# Patient Record
Sex: Female | Born: 1965 | Race: Black or African American | Hispanic: No | Marital: Single | Smoking: Never smoker
Health system: Southern US, Community
[De-identification: ages and names within clinical notes are randomized; demographics above are authoritative.]

## PROBLEM LIST (undated history)

## (undated) DIAGNOSIS — M797 Fibromyalgia: Secondary | ICD-10-CM

## (undated) DIAGNOSIS — F329 Major depressive disorder, single episode, unspecified: Secondary | ICD-10-CM

## (undated) DIAGNOSIS — F32A Depression, unspecified: Secondary | ICD-10-CM

## (undated) HISTORY — DX: Fibromyalgia: M79.7

## (undated) HISTORY — DX: Major depressive disorder, single episode, unspecified: F32.9

## (undated) HISTORY — DX: Depression, unspecified: F32.A

## (undated) HISTORY — PX: OTHER SURGICAL HISTORY: SHX169

---

## 2018-04-27 ENCOUNTER — Encounter: Payer: Self-pay | Admitting: Neurology

## 2018-07-27 ENCOUNTER — Encounter: Payer: Self-pay | Admitting: Neurology

## 2018-07-27 ENCOUNTER — Ambulatory Visit: Payer: Medicaid Other | Admitting: Neurology

## 2018-07-27 VITALS — BP 110/70 | HR 112 | Ht 63.0 in | Wt 151.0 lb

## 2018-07-27 DIAGNOSIS — R200 Anesthesia of skin: Secondary | ICD-10-CM

## 2018-07-27 DIAGNOSIS — R202 Paresthesia of skin: Secondary | ICD-10-CM

## 2018-07-27 DIAGNOSIS — Z9889 Other specified postprocedural states: Secondary | ICD-10-CM | POA: Diagnosis not present

## 2018-07-27 DIAGNOSIS — M542 Cervicalgia: Secondary | ICD-10-CM | POA: Diagnosis not present

## 2018-07-27 NOTE — Patient Instructions (Addendum)
MRI cervical spine without contrast  We will call you with the results 
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
## 2018-08-09 ENCOUNTER — Ambulatory Visit
Admission: RE | Admit: 2018-08-09 | Discharge: 2018-08-09 | Disposition: A | Discharge status: Home / Self Care | Payer: Medicaid Other | Source: Ambulatory Visit | Attending: Neurology | Admitting: Neurology

## 2018-08-09 DIAGNOSIS — M542 Cervicalgia: Secondary | ICD-10-CM

## 2018-08-09 DIAGNOSIS — R200 Anesthesia of skin: Secondary | ICD-10-CM

## 2018-08-09 DIAGNOSIS — Z9889 Other specified postprocedural states: Secondary | ICD-10-CM

## 2018-08-09 DIAGNOSIS — R202 Paresthesia of skin: Secondary | ICD-10-CM

## 2018-08-11 ENCOUNTER — Telehealth: Payer: Self-pay | Admitting: *Deleted

## 2018-08-11 NOTE — Telephone Encounter (Signed)
Called patient and gave her the results. She would like to follow up with her surgeon but can't get in touch with him.  Informed her that I will try to locate the surgeon through [facility].

## 2018-08-11 NOTE — Telephone Encounter (Signed)
-----   Message from Glendale Chard, DO sent at 08/11/2018  3:14 PM EST ----- Please inform patient that her MRI cervical spine shows age-related changes and arthritis of the spine and there is narrowing where the nerves exit the spinal cord on both the right and left side, which could cause her arm pain.Marland Kitchen  Hardware from prior surgery looks stable.  Please find out who did her prior neck surgery and we can refer back to them for their opinion, unless she would like to try neck physiotherapy.  Thanks.
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
## 2022-09-02 ENCOUNTER — Ambulatory Visit: Payer: Medicaid Other | Attending: Physician Assistant | Admitting: Physical Therapy

## 2022-09-02 ENCOUNTER — Encounter: Payer: Self-pay | Admitting: Physical Therapy

## 2022-09-02 ENCOUNTER — Other Ambulatory Visit: Payer: Self-pay

## 2022-09-02 DIAGNOSIS — M542 Cervicalgia: Secondary | ICD-10-CM | POA: Insufficient documentation

## 2022-09-02 DIAGNOSIS — M6281 Muscle weakness (generalized): Secondary | ICD-10-CM | POA: Insufficient documentation

## 2022-09-02 DIAGNOSIS — R293 Abnormal posture: Secondary | ICD-10-CM | POA: Insufficient documentation

## 2022-09-02 DIAGNOSIS — M62838 Other muscle spasm: Secondary | ICD-10-CM | POA: Insufficient documentation

## 2022-09-04 ENCOUNTER — Ambulatory Visit: Payer: Medicaid Other

## 2022-09-04 DIAGNOSIS — M62838 Other muscle spasm: Secondary | ICD-10-CM

## 2022-09-04 DIAGNOSIS — M542 Cervicalgia: Secondary | ICD-10-CM | POA: Diagnosis not present

## 2022-09-04 DIAGNOSIS — M6281 Muscle weakness (generalized): Secondary | ICD-10-CM

## 2022-09-04 DIAGNOSIS — R293 Abnormal posture: Secondary | ICD-10-CM

## 2022-09-04 NOTE — Therapy (Signed)
OUTPATIENT PHYSICAL THERAPY TREATMENT   Patient Name: Sylvia Martin MRN: [id number] DOB:05/11/1966, 57 y.o., female Today's Date: 09/04/2022  END OF SESSION:  PT End of Session - 09/04/22 1608     Visit Number 2    Date for PT Re-Evaluation 10/14/22    Authorization Type [employer]    PT Start Time 1525    PT Stop Time 1610   15 min moist heat   PT Time Calculation (min) 45 min    Activity Tolerance Patient limited by pain;Patient tolerated treatment well    Behavior During Therapy [facility] for tasks assessed/performed              Past Medical History:  Diagnosis Date   Depression    Fibromyalgia    Past Surgical History:  Procedure Laterality Date   ACDF C3-4     There are no problems to display for this patient.   PCP: Jackie Plum, MD  REFERRING PROVIDER: Norm Salt, [place]  REFERRING DIAG: [phone] (ICD-10-CM) - Cervicalgia   THERAPY DIAG:  Cervicalgia  Other muscle spasm  Muscle weakness (generalized)  Abnormal posture  RATIONALE FOR EVALUATION AND TREATMENT: Rehabilitation  ONSET DATE: July 2023   NEXT MD VISIT: pt unsure    SUBJECTIVE:                                                                                                                                                                                                         SUBJECTIVE STATEMENT: Pt reports improved pain today. Definitely thinks the exercises are helping.   PAIN:  Are you having pain? Yes: NPRS scale: 3/10 Pain location: bilat shoulder and neck  Pain description: achy throbbing, sharp   Aggravating factors: housework, yard work goes to 10/10 Relieving factors: rest & medication   PERTINENT HISTORY:  ACDF C4-5 with revision of C3-4 on 09/15/19; cervical fusion 2006;  Depression, Fibromyalgia, Neuropathy, DDD, HTN, Prediabetes   PRECAUTIONS: None  WEIGHT BEARING RESTRICTIONS: No  FALLS:  Has patient fallen in last 6 months? No  LIVING ENVIRONMENT: Lives  with: lives alone Lives in: House/apartment Stairs: No Has following equipment at home: None  OCCUPATION: On disability  PLOF: Independent and Leisure: house/yard work   PATIENT GOALS: be able to do house and yard work, muscle spasms and figure out why they are still there despite medication.    OBJECTIVE:   DIAGNOSTIC FINDINGS:  04/06/22 MRI Cervical Spine WO Contrast:  1. Although the study is mildly motion degraded, no acute findings or clear explanation for the patient's symptoms is  identified.  2. A right paracentral disc protrusion at C7-T1 and associated endplate degenerative changes have progressed from MRI of 2020. No cord deformity. Moderate right and mild left foraminal narrowing.  3. Solid interbody fusion post C3-5 fusion with mild residual chronic foraminal narrowing as detailed above.  4. Chronic foraminal narrowing at C5-6 and C6-7 due to spondylosis.   03/25/22 XR Spine Cervical:  1.  Postsurgical changes of C3-C5 cervical fusion with anterior plate and screws at M8-U1 and interbody spacers at C3-C4 and C4-C5. Hardware appears intact and similarly aligned. There is at least partial osseous fusion across the C3-C4 disc space. Osseous fusion across the C4-C5 disc space is less definitive. Recommend correlation with recent CT cervical spine.  2.  Evaluation for abnormal dynamic motion is limited due to poor flexion and extension views. If there is concern for pseudoarthrosis, repeat flexion and extension views should be considered.  3.  Multilevel degenerative disc and facet disease similar to prior, more completely demonstrated on recent CT. Degenerative disc disease appears most pronounced at C5-C6.   PATIENT SURVEYS:  NDI 30/50  COGNITION: Overall cognitive status: Within functional limits for tasks assessed  SENSATION: Tingling down both arms   POSTURE: rounded shoulders, forward head, and weight shift right   PALPATION: Tenderness along bilateral upper traps,  infra & supraspinatus, rhomboids, levator scapulae, biceps, triceps, forearms - very limited tolerance to palpation   CERVICAL ROM:   Active ROM AROM (deg) eval  Flexion 16 *  Extension 19*  Right lateral flexion 25 *  Left lateral flexion 27*  Right rotation Restricted and painful  Left rotation Restricted and painful    (Blank rows = not tested, *=pain )  UPPER EXTREMITY ROM:  Active ROM Right eval Left eval  Shoulder flexion Supine 160* Supine 160*  Shoulder extension WNL * WNL *  Shoulder abduction Supine 128* Supine 130*   Shoulder adduction    Shoulder extension    Shoulder internal rotation WNL* WNL*  Shoulder external rotation WNL* WNL*  Elbow flexion 55 40  Elbow extension -10 -10  Wrist flexion    Wrist extension    Wrist ulnar deviation    Wrist radial deviation    Wrist pronation    Wrist supination     (Blank rows = not tested, * = pain)  UPPER EXTREMITY MMT: defer due to pain intolerance   MMT Right eval Left eval  Shoulder flexion    Shoulder extension    Shoulder abduction    Shoulder adduction    Shoulder extension    Shoulder internal rotation    Shoulder external rotation    Middle trapezius    Lower trapezius    Elbow flexion    Elbow extension    Wrist flexion    Wrist extension    Wrist ulnar deviation    Wrist radial deviation    Wrist pronation    Wrist supination    Grip strength     (Blank rows = not tested)  JOINT MOBILITY:  Unable to test due to pain  CERVICAL SPECIAL TESTS:  Unable to test due to pain  FUNCTIONAL TESTS:  Unable to test due to pain   TODAY'S TREATMENT:  DATE:  09/04/22 Therapeutic Exercise: to improve strength and mobility.  Demo, verbal and tactile cues throughout for technique.  UBE L1.0 x 76min UT stretch 3x10" each side; 15 sec with moist heat  Levator stretch 3x10"  each side; 15 sec with moist heat Supine chin tuck 10x3" Seated shoulder rolls 10x Seated SCM stretch 3x10" Seated shoulder squeeze 10x3"  MHP for 15 min to cervical spine  09/02/22 Initial Eval :  UT Stx 2x10"  LS Stx 2x10"   PATIENT EDUCATION:  Education details: Initial HEP, Initial Eval findings  Person educated: Patient Education method: Explanation, Demonstration, Tactile cues, and Handouts Education comprehension: verbalized understanding, returned demonstration, and needs further education  HOME EXERCISE PROGRAM: Access Code: [email or web address] URL: [email or web address] Date: 09/04/2022 Prepared by: Clarene Essex  Exercises - Seated Upper Trapezius Stretch  - 1 x daily - 7 x weekly - 2 sets - 10 reps - 10 sec   hold - Seated Levator Scapulae Stretch  - 1 x daily - 7 x weekly - 2 sets - 10 reps - 10 second hold - Sternocleidomastoid Stretch  - 1 x daily - 7 x weekly - 3-5 sets - 10 sec hold - Seated Scapular Retraction  - 1 x daily - 7 x weekly - 2 sets - 10 reps   ASSESSMENT:  CLINICAL IMPRESSION: Pt mostly able to complete exercises today w/o increased pain but was limited with certain movements. She had ok tolerance for neck stretches and AROM, although noting pain with chin tucks. Added SCM to session d/t reports of pain along the muscle belly. She did better with the postural exercises and pec stretching but overall very guarded, slow, cautious movements. Concluded session with MHP to reduce pain and stiffness in neck/shoulders.  OBJECTIVE IMPAIRMENTS: decreased activity tolerance, decreased knowledge of condition, decreased knowledge of use of DME, decreased mobility, decreased ROM, decreased strength, increased fascial restrictions, impaired perceived functional ability, increased muscle spasms, impaired flexibility, impaired sensation, impaired tone, impaired UE functional use, improper body mechanics, postural dysfunction, and pain.   ACTIVITY LIMITATIONS:  carrying, lifting, bending, sitting, sleeping, bathing, reach over head, and hygiene/grooming  PARTICIPATION LIMITATIONS: meal prep, cleaning, laundry, driving, community activity, and yard work  PERSONAL FACTORS: Past/current experiences, Time since onset of injury/illness/exacerbation, and 3+ comorbidities: ACDF C4-5 with revision of C3-4 on 09/15/19; cervical fusion 2006;  Depression, Fibromyalgia, Neuropathy, DDD, HTN, Prediabetes  are also affecting patient's functional outcome.   REHAB POTENTIAL: Good  CLINICAL DECISION MAKING: Evolving/moderate complexity  EVALUATION COMPLEXITY: Moderate   GOALS: Goals reviewed with patient? Yes  SHORT TERM GOALS: Target date: 09/23/2022  Pt will be independent with original HEP.  Baseline:  Goal status: IN PROGRESS  2.  Pt will verbalize a decrease in pain by 25% to increase her QOL and increase her involvement in her daily activities.  Baseline: NPS 7/10  Goal status: IN PROGRESS  3.  Pt will demonstrate a decrease in pain while performing UE MMT to improve her tolerance to strength training and increase her daily function Baseline: unable to test due to pain  Goal status: IN PROGRESS  4.  Pt will demonstrate an increase of 10 in bilateral shoulder flexion and abduction to increase her participation in yard and house work.  Baseline: see ROM chart  Goal status: IN PROGRESS  LONG TERM GOALS: Target date: 10/14/2022  Pt will demonstrate independence with ongoing HEP for self-management at home.  Baseline:  Goal status: IN PROGRESS  2.  Pt will report a score  of </= 22.5 on the NDI to demonstrate an increase in function and decrease in pain Baseline: 30/50  Goal status: IN PROGRESS  3.  Pt will verbalize a decrease in pain of 50-75% to increase her QOL and increase her involvement in her community Baseline: NPS 7/10  Goal status: IN PROGRESS  4.  Patient to demonstrate improved upright posture with posterior shoulder girdle engaged  to promote improved glenohumeral joint mobility.  Baseline:  Goal status: IN PROGRESS  5.  Pt will demonstrate a decrease in muscle tension along her bilateral UT and periscapular muscles as evidenced by more relaxed posture in neck and upper shoulders. Baseline:  Goal status: IN PROGRESS  6.  Pt will be able to do her yard and house work with minimal to no pain and discomfort.  Baseline:  Goal status: IN PROGRESS  7.  Pt will increase her grossly cervical ROM in order to be able to look over her shoulder and surrounding areas while driving.  Baseline:  Goal status: IN PROGRESS   PLAN:  PT FREQUENCY: 2x/week  PT DURATION: 6 weeks  PLANNED INTERVENTIONS: Therapeutic exercises, Therapeutic activity, Neuromuscular re-education, Patient/Family education, Self Care, Joint mobilization, DME instructions, Dry Needling, Electrical stimulation, Spinal mobilization, Moist heat, Taping, Ultrasound, Manual therapy, and Re-evaluation  PLAN FOR NEXT SESSION: try starting session with moist heat/e-stim to increase tolerance to movement, on next visit with PT finish objective testing per pt pain tolerance.   Artist Pais, PTA 09/04/2022, 4:22 PM

## 2022-09-09 ENCOUNTER — Ambulatory Visit: Payer: Medicaid Other | Admitting: Physical Therapy

## 2022-09-12 ENCOUNTER — Ambulatory Visit: Payer: Medicaid Other | Admitting: Physical Therapy

## 2022-09-16 ENCOUNTER — Ambulatory Visit: Payer: Medicaid Other

## 2022-09-16 DIAGNOSIS — M542 Cervicalgia: Secondary | ICD-10-CM | POA: Diagnosis not present

## 2022-09-16 DIAGNOSIS — M6281 Muscle weakness (generalized): Secondary | ICD-10-CM

## 2022-09-16 DIAGNOSIS — M62838 Other muscle spasm: Secondary | ICD-10-CM

## 2022-09-16 DIAGNOSIS — R293 Abnormal posture: Secondary | ICD-10-CM

## 2022-09-16 NOTE — Therapy (Signed)
OUTPATIENT PHYSICAL THERAPY TREATMENT   Patient Name: Sylvia Martin MRN: [id number] DOB:1966/03/13, 57 y.o., female Today's Date: 09/16/2022  END OF SESSION:  PT End of Session - 09/16/22 1701     Visit Number 3    Date for PT Re-Evaluation 10/14/22    Authorization Type [employer] Medicaid    PT Start Time 1612    PT Stop Time [id number]    PT Time Calculation (min) 53 min    Activity Tolerance Patient limited by pain;Patient tolerated treatment well    Behavior During Therapy [facility] for tasks assessed/performed               Past Medical History:  Diagnosis Date   Depression    Fibromyalgia    Past Surgical History:  Procedure Laterality Date   ACDF C3-4     There are no problems to display for this patient.   PCP: Benito Mccreedy, MD  REFERRING PROVIDER: Trey Sailors, [place]  REFERRING DIAG: [phone] (ICD-10-CM) - Cervicalgia   THERAPY DIAG:  Cervicalgia  Other muscle spasm  Muscle weakness (generalized)  Abnormal posture  RATIONALE FOR EVALUATION AND TREATMENT: Rehabilitation  ONSET DATE: July 2023   NEXT MD VISIT: pt unsure    SUBJECTIVE:                                                                                                                                                                                                         SUBJECTIVE STATEMENT: Pt reports aching pain today.  PAIN:  Are you having pain? Yes: NPRS scale: 4/10 Pain location: bilat shoulder and neck  Pain description: achy   Aggravating factors: housework, yard work goes to 10/10 Relieving factors: rest & medication   PERTINENT HISTORY:  ACDF C4-5 with revision of C3-4 on 09/15/19; cervical fusion 2006;  Depression, Fibromyalgia, Neuropathy, DDD, HTN, Prediabetes   PRECAUTIONS: None  WEIGHT BEARING RESTRICTIONS: No  FALLS:  Has patient fallen in last 6 months? No  LIVING ENVIRONMENT: Lives with: lives alone Lives in: House/apartment Stairs: No Has following  equipment at home: None  OCCUPATION: On disability  PLOF: Independent and Leisure: house/yard work   PATIENT GOALS: be able to do house and yard work, muscle spasms and figure out why they are still there despite medication.    OBJECTIVE:   DIAGNOSTIC FINDINGS:  04/06/22 MRI Cervical Spine WO Contrast:  1. Although the study is mildly motion degraded, no acute findings or clear explanation for the patient's symptoms is identified.  2. A right paracentral disc protrusion at C7-T1 and associated endplate  degenerative changes have progressed from MRI of 2020. No cord deformity. Moderate right and mild left foraminal narrowing.  3. Solid interbody fusion post C3-5 fusion with mild residual chronic foraminal narrowing as detailed above.  4. Chronic foraminal narrowing at C5-6 and C6-7 due to spondylosis.   03/25/22 XR Spine Cervical:  1.  Postsurgical changes of C3-C5 cervical fusion with anterior plate and screws at [id number] and interbody spacers at C3-C4 and C4-C5. Hardware appears intact and similarly aligned. There is at least partial osseous fusion across the C3-C4 disc space. Osseous fusion across the C4-C5 disc space is less definitive. Recommend correlation with recent CT cervical spine.  2.  Evaluation for abnormal dynamic motion is limited due to poor flexion and extension views. If there is concern for pseudoarthrosis, repeat flexion and extension views should be considered.  3.  Multilevel degenerative disc and facet disease similar to prior, more completely demonstrated on recent CT. Degenerative disc disease appears most pronounced at C5-C6.   PATIENT SURVEYS:  NDI 30/50  COGNITION: Overall cognitive status: Within functional limits for tasks assessed  SENSATION: Tingling down both arms   POSTURE: rounded shoulders, forward head, and weight shift right   PALPATION: Tenderness along bilateral upper traps, infra & supraspinatus, rhomboids, levator scapulae, biceps, triceps,  forearms - very limited tolerance to palpation   CERVICAL ROM:   Active ROM AROM (deg) eval  Flexion 16 *  Extension 19*  Right lateral flexion 25 *  Left lateral flexion 27*  Right rotation Restricted and painful  Left rotation Restricted and painful    (Blank rows = not tested, *=pain )  UPPER EXTREMITY ROM:  Active ROM Right eval Left eval  Shoulder flexion Supine 160* Supine 160*  Shoulder extension WNL * WNL *  Shoulder abduction Supine 128* Supine 130*   Shoulder adduction    Shoulder extension    Shoulder internal rotation WNL* WNL*  Shoulder external rotation WNL* WNL*  Elbow flexion 55 40  Elbow extension -10 -10  Wrist flexion    Wrist extension    Wrist ulnar deviation    Wrist radial deviation    Wrist pronation    Wrist supination     (Blank rows = not tested, * = pain)  UPPER EXTREMITY MMT: defer due to pain intolerance   MMT Right eval Left eval  Shoulder flexion    Shoulder extension    Shoulder abduction    Shoulder adduction    Shoulder extension    Shoulder internal rotation    Shoulder external rotation    Middle trapezius    Lower trapezius    Elbow flexion    Elbow extension    Wrist flexion    Wrist extension    Wrist ulnar deviation    Wrist radial deviation    Wrist pronation    Wrist supination    Grip strength     (Blank rows = not tested)  JOINT MOBILITY:  Unable to test due to pain  CERVICAL SPECIAL TESTS:  Unable to test due to pain  FUNCTIONAL TESTS:  Unable to test due to pain   TODAY'S TREATMENT:  DATE:  09/16/22 Therapeutic Exercise: to improve strength and mobility.  Demo, verbal and tactile cues throughout for technique.  UBE L1.0 x 4 min with moist heat Doorway pec stretch with moist heat 3x15" Standing scap retraction in doorway x 10  Standing chin tuck in doorway x  10 Cervical isometrics:  -flexion x 5 3 sec hold  - sidebend x 5 3 sec hold  - rotation x 5 3 sec hold Seated thoracic ext x 10  Attempted IASTM with foam roll to cervical and thoracic PS muscles  Estim: IFC 80-150 Hz intensity to tolerance x 12 min with moist heat  09/04/22 Therapeutic Exercise: to improve strength and mobility.  Demo, verbal and tactile cues throughout for technique.  UBE L1.0 x 92mn UT stretch 3x10" each side; 15 sec with moist heat  Levator stretch 3x10" each side; 15 sec with moist heat Supine chin tuck 10x3" Seated shoulder rolls 10x Seated SCM stretch 3x10" Seated shoulder squeeze 10x3"  MHP for 15 min to cervical spine  09/02/22 Initial Eval :  UT Stx 2x10"  LS Stx 2x10"   PATIENT EDUCATION:  Education details: Initial HEP, Initial Eval findings  Person educated: Patient Education method: Explanation, Demonstration, Tactile cues, and Handouts Education comprehension: verbalized understanding, returned demonstration, and needs further education  HOME EXERCISE PROGRAM: Access Code: [email or web address] URL: [email or web address] Date: 09/16/2022 Prepared by: BClarene Essex Exercises - Seated Upper Trapezius Stretch  - 1 x daily - 7 x weekly - 2 sets - 10 reps - 10 sec   hold - Seated Levator Scapulae Stretch  - 1 x daily - 7 x weekly - 2 sets - 10 reps - 10 second hold - Sternocleidomastoid Stretch  - 1 x daily - 7 x weekly - 3-5 sets - 10 sec hold - Seated Scapular Retraction  - 1 x daily - 7 x weekly - 2 sets - 10 reps - Standing Isometric Cervical Flexion with Manual Resistance  - 1 x daily - 7 x weekly - 2 sets - 10 reps - 5-10 sec hold - Standing Isometric Cervical Sidebending with Manual Resistance  - 1 x daily - 7 x weekly - 2 sets - 10 reps - 5-10 sec hold - Seated Isometric Cervical Rotation  - 1 x daily - 7 x weekly - 2 sets - 5 reps - 5-10 sec hold   ASSESSMENT:  CLINICAL IMPRESSION: Pt continues to be guarded with slow, cautious  movements throughout session. Attempted  starting session with heat, however this did not improve tolerance for UBE warm up. The best exercises that minimized pain today were the cervical isometrics, which were added to HEP. Pt again was very sensitive to touch, we were unable to do any STM but did estim after session which helped reduce some tension and pain.   OBJECTIVE IMPAIRMENTS: decreased activity tolerance, decreased knowledge of condition, decreased knowledge of use of DME, decreased mobility, decreased ROM, decreased strength, increased fascial restrictions, impaired perceived functional ability, increased muscle spasms, impaired flexibility, impaired sensation, impaired tone, impaired UE functional use, improper body mechanics, postural dysfunction, and pain.   ACTIVITY LIMITATIONS: carrying, lifting, bending, sitting, sleeping, bathing, reach over head, and hygiene/grooming  PARTICIPATION LIMITATIONS: meal prep, cleaning, laundry, driving, community activity, and yard work  PERSONAL FACTORS: Past/current experiences, Time since onset of injury/illness/exacerbation, and 3+ comorbidities: ACDF C4-5 with revision of C3-4 on 09/15/19; cervical fusion 2006;  Depression, Fibromyalgia, Neuropathy, DDD, HTN, Prediabetes  are also affecting patient's functional outcome.   REHAB  POTENTIAL: Good  CLINICAL DECISION MAKING: Evolving/moderate complexity  EVALUATION COMPLEXITY: Moderate   GOALS: Goals reviewed with patient? Yes  SHORT TERM GOALS: Target date: 09/23/2022  Pt will be independent with original HEP.  Baseline:  Goal status: IN PROGRESS  2.  Pt will verbalize a decrease in pain by 25% to increase her QOL and increase her involvement in her daily activities.  Baseline: NPS 7/10  Goal status: IN PROGRESS  3.  Pt will demonstrate a decrease in pain while performing UE MMT to improve her tolerance to strength training and increase her daily function Baseline: unable to test due to  pain  Goal status: IN PROGRESS  4.  Pt will demonstrate an increase of 10 in bilateral shoulder flexion and abduction to increase her participation in yard and house work.  Baseline: see ROM chart  Goal status: IN PROGRESS  LONG TERM GOALS: Target date: 10/14/2022  Pt will demonstrate independence with ongoing HEP for self-management at home.  Baseline:  Goal status: IN PROGRESS  2.  Pt will report a score of </= 22.5 on the NDI to demonstrate an increase in function and decrease in pain Baseline: 30/50  Goal status: IN PROGRESS  3.  Pt will verbalize a decrease in pain of 50-75% to increase her QOL and increase her involvement in her community Baseline: NPS 7/10  Goal status: IN PROGRESS  4.  Patient to demonstrate improved upright posture with posterior shoulder girdle engaged to promote improved glenohumeral joint mobility.  Baseline:  Goal status: IN PROGRESS  5.  Pt will demonstrate a decrease in muscle tension along her bilateral UT and periscapular muscles as evidenced by more relaxed posture in neck and upper shoulders. Baseline:  Goal status: IN PROGRESS  6.  Pt will be able to do her yard and house work with minimal to no pain and discomfort.  Baseline:  Goal status: IN PROGRESS  7.  Pt will increase her grossly cervical ROM in order to be able to look over her shoulder and surrounding areas while driving.  Baseline:  Goal status: IN PROGRESS   PLAN:  PT FREQUENCY: 2x/week  PT DURATION: 6 weeks  PLANNED INTERVENTIONS: Therapeutic exercises, Therapeutic activity, Neuromuscular re-education, Patient/Family education, Self Care, Joint mobilization, DME instructions, Dry Needling, Electrical stimulation, Spinal mobilization, Moist heat, Taping, Ultrasound, Manual therapy, and Re-evaluation  PLAN FOR NEXT SESSION: try starting session with moist heat/e-stim to increase tolerance to movement, on next visit with PT finish objective testing per pt pain  tolerance.   Artist Pais, PTA 09/16/2022, 5:53 PM

## 2022-09-19 ENCOUNTER — Encounter: Payer: Self-pay | Admitting: Physical Therapy

## 2022-09-19 ENCOUNTER — Ambulatory Visit: Payer: Medicaid Other | Admitting: Physical Therapy

## 2022-09-19 DIAGNOSIS — M62838 Other muscle spasm: Secondary | ICD-10-CM

## 2022-09-19 DIAGNOSIS — M542 Cervicalgia: Secondary | ICD-10-CM

## 2022-09-19 DIAGNOSIS — M6281 Muscle weakness (generalized): Secondary | ICD-10-CM

## 2022-09-19 DIAGNOSIS — R293 Abnormal posture: Secondary | ICD-10-CM

## 2022-09-19 NOTE — Therapy (Addendum)
OUTPATIENT PHYSICAL THERAPY TREATMENT   Patient Name: Sylvia Martin MRN: [id number] DOB:January 16, 1966, 57 y.o., female Today's Date: 09/19/2022  END OF SESSION:  PT End of Session - 09/19/22 1530     Visit Number 4    Date for PT Re-Evaluation 10/14/22    Authorization Type [employer]    PT Start Time [id number]    PT Stop Time 1625    PT Time Calculation (min) 55 min    Activity Tolerance Patient tolerated treatment well    Behavior During Therapy WFL for tasks assessed/performed                Past Medical History:  Diagnosis Date   Depression    Fibromyalgia    Past Surgical History:  Procedure Laterality Date   ACDF C3-4     There are no problems to display for this patient.   PCP: Benito Mccreedy, MD  REFERRING PROVIDER: Trey Sailors, PA  REFERRING DIAG: [phone] (ICD-10-CM) - Cervicalgia   THERAPY DIAG:  Cervicalgia  Other muscle spasm  Muscle weakness (generalized)  Abnormal posture  RATIONALE FOR EVALUATION AND TREATMENT: Rehabilitation  ONSET DATE: July 2023   NEXT MD VISIT: pt unsure    SUBJECTIVE:                                                                                                                                                                                                         SUBJECTIVE STATEMENT: Pt reports aching pain today in L/R arm and Trap and is sore from last session, HEP has been going fine although she is still in pain when doing them.   PAIN:  Are you having pain? Yes: NPRS scale: 8/10 Pain location: bilat shoulder and neck  Pain description: achy   Aggravating factors: housework, yard work goes to 10/10 Relieving factors: rest & medication   PERTINENT HISTORY:  ACDF C4-5 with revision of C3-4 on 09/15/19; cervical fusion 2006;  Depression, Fibromyalgia, Neuropathy, DDD, HTN, Prediabetes   PRECAUTIONS: None  WEIGHT BEARING RESTRICTIONS: No  FALLS:  Has patient fallen in last 6 months? No  LIVING  ENVIRONMENT: Lives with: lives alone Lives in: House/apartment Stairs: No Has following equipment at home: None  OCCUPATION: On disability  PLOF: Independent and Leisure: house/yard work   PATIENT GOALS: be able to do house and yard work, muscle spasms and figure out why they are still there despite medication.    OBJECTIVE:   DIAGNOSTIC FINDINGS:  04/06/22 MRI Cervical Spine WO Contrast:  1. Although the study is mildly motion degraded,  no acute findings or clear explanation for the patient's symptoms is identified.  2. A right paracentral disc protrusion at C7-T1 and associated endplate degenerative changes have progressed from MRI of 2020. No cord deformity. Moderate right and mild left foraminal narrowing.  3. Solid interbody fusion post C3-5 fusion with mild residual chronic foraminal narrowing as detailed above.  4. Chronic foraminal narrowing at C5-6 and C6-7 due to spondylosis.   03/25/22 XR Spine Cervical:  1.  Postsurgical changes of C3-C5 cervical fusion with anterior plate and screws at [id number] and interbody spacers at C3-C4 and C4-C5. Hardware appears intact and similarly aligned. There is at least partial osseous fusion across the C3-C4 disc space. Osseous fusion across the C4-C5 disc space is less definitive. Recommend correlation with recent CT cervical spine.  2.  Evaluation for abnormal dynamic motion is limited due to poor flexion and extension views. If there is concern for pseudoarthrosis, repeat flexion and extension views should be considered.  3.  Multilevel degenerative disc and facet disease similar to prior, more completely demonstrated on recent CT. Degenerative disc disease appears most pronounced at C5-C6.   PATIENT SURVEYS:  NDI 30/50  COGNITION: Overall cognitive status: Within functional limits for tasks assessed  SENSATION: Tingling down both arms   POSTURE: rounded shoulders, forward head, and weight shift right   PALPATION: Tenderness along  bilateral upper traps, infra & supraspinatus, rhomboids, levator scapulae, biceps, triceps, forearms - very limited tolerance to palpation   CERVICAL ROM:   Active ROM AROM (deg) eval  Flexion 16 *  Extension 19*  Right lateral flexion 25 *  Left lateral flexion 27*  Right rotation Restricted and painful  Left rotation Restricted and painful    (Blank rows = not tested, *=pain )  UPPER EXTREMITY ROM:  Active ROM Right eval Left eval  Shoulder flexion Supine 160* Supine 160*  Shoulder extension WNL * WNL *  Shoulder abduction Supine 128* Supine 130*   Shoulder adduction    Shoulder extension    Shoulder internal rotation WNL* WNL*  Shoulder external rotation WNL* WNL*  Elbow flexion 55 40  Elbow extension -10 -10  Wrist flexion    Wrist extension    Wrist ulnar deviation    Wrist radial deviation    Wrist pronation    Wrist supination     (Blank rows = not tested, * = pain)  UPPER EXTREMITY MMT: defer due to pain intolerance   MMT Right eval Left eval  Shoulder flexion    Shoulder extension    Shoulder abduction    Shoulder adduction    Shoulder extension    Shoulder internal rotation    Shoulder external rotation    Middle trapezius    Lower trapezius    Elbow flexion    Elbow extension    Wrist flexion    Wrist extension    Wrist ulnar deviation    Wrist radial deviation    Wrist pronation    Wrist supination    Grip strength     (Blank rows = not tested)  JOINT MOBILITY:  Unable to test due to pain  CERVICAL SPECIAL TESTS:  Unable to test due to pain  FUNCTIONAL TESTS:  Unable to test due to pain   TODAY'S TREATMENT:  DATE:   09/19/22 THERAPEUTIC EXERCISE: to improve flexibility, strength and mobility.  Verbal and tactile cues throughout for technique. Moist heat + UBE L1 x5 min forward  Cervical isometrics:  -  flexion x 5 3 sec hold  - sidebend x 5 3 sec hold  - rotation x 5 3 sec hold UT stretch 3x10" each side with moist heat  Levator stretch 3x10" each side with moist heat Seated shoulder rolls 2x10 Seated Scapular squeezes 2x10  Seated Chin Tucks x10  Shoulder Horizontal Abduction AROM w/ scapular retraction x10  Doorway pec stretch 3x10"  MODALITIES:  IFC to B neck and upper shoulders, 80-150 Hz, intensity to patient tolerance x 15 min  Moist heat to B neck and upper shoulders with estim   09/16/22 Therapeutic Exercise: to improve strength and mobility.  Demo, verbal and tactile cues throughout for technique.  UBE L1.0 x 4 min with moist heat Doorway pec stretch with moist heat 3x15" Standing scap retraction in doorway x 10  Standing chin tuck in doorway x 10 Cervical isometrics:  -flexion x 5 3 sec hold  - sidebend x 5 3 sec hold  - rotation x 5 3 sec hold Seated thoracic ext x 10  Attempted IASTM with foam roll to cervical and thoracic PS muscles  Estim: IFC 80-150 Hz intensity to tolerance x 12 min with moist heat   09/04/22 Therapeutic Exercise: to improve strength and mobility.  Demo, verbal and tactile cues throughout for technique.  UBE L1.0 x 71mn UT stretch 3x10" each side; 15 sec with moist heat  Levator stretch 3x10" each side; 15 sec with moist heat Supine chin tuck 10x3" Seated shoulder rolls 10x Seated SCM stretch 3x10" Seated shoulder squeeze 10x3"  MHP for 15 min to cervical spine   PATIENT EDUCATION:  Education details: Ongoing POC Person educated: Patient Education method: Explanation, T[employer] treasurercues, and Verbal cues Education comprehension: verbalized understanding and needs further education  HOME EXERCISE PROGRAM: Access Code: [email or web address] URL: [email or web address] Date: 09/16/2022 Prepared by: BClarene Essex Exercises - Seated Upper Trapezius Stretch  - 1 x daily - 7 x weekly - 2 sets - 10 reps - 10 sec   hold - Seated Levator  Scapulae Stretch  - 1 x daily - 7 x weekly - 2 sets - 10 reps - 10 second hold - Sternocleidomastoid Stretch  - 1 x daily - 7 x weekly - 3-5 sets - 10 sec hold - Seated Scapular Retraction  - 1 x daily - 7 x weekly - 2 sets - 10 reps - Standing Isometric Cervical Flexion with Manual Resistance  - 1 x daily - 7 x weekly - 2 sets - 10 reps - 5-10 sec hold - Standing Isometric Cervical Sidebending with Manual Resistance  - 1 x daily - 7 x weekly - 2 sets - 10 reps - 5-10 sec hold - Seated Isometric Cervical Rotation  - 1 x daily - 7 x weekly - 2 sets - 5 reps - 5-10 sec hold   ASSESSMENT:  CLINICAL IMPRESSION:  Pt came into today's session with an 8/10 pain and continues to move in a guarded position. Pt was able to perform the UBE but had to take a break in the middle of the 5 min in order to continue. Pt continues to need frequent breaks in between exercises and has a high pain response with interventions. Pt was able to conduct scapular retraction exercises and was reminded to try to open up her chest  in order to promote good posture when sitting. Pt was very sensitive to touch so PT was unable to do any STM but did estim after session which helped reduce some tension and pain. Pt will continue to benefit from skilled PT interventions pain permitting to address her increased TrPs and restricted mobility to improve her QOL.  OBJECTIVE IMPAIRMENTS: decreased activity tolerance, decreased knowledge of condition, decreased knowledge of use of DME, decreased mobility, decreased ROM, decreased strength, increased fascial restrictions, impaired perceived functional ability, increased muscle spasms, impaired flexibility, impaired sensation, impaired tone, impaired UE functional use, improper body mechanics, postural dysfunction, and pain.   ACTIVITY LIMITATIONS: carrying, lifting, bending, sitting, sleeping, bathing, reach over head, and hygiene/grooming  PARTICIPATION LIMITATIONS: meal prep, cleaning,  laundry, driving, community activity, and yard work  PERSONAL FACTORS: Past/current experiences, Time since onset of injury/illness/exacerbation, and 3+ comorbidities: ACDF C4-5 with revision of C3-4 on 09/15/19; cervical fusion 2006;  Depression, Fibromyalgia, Neuropathy, DDD, HTN, Prediabetes  are also affecting patient's functional outcome.   REHAB POTENTIAL: Good  CLINICAL DECISION MAKING: Evolving/moderate complexity  EVALUATION COMPLEXITY: Moderate   GOALS: Goals reviewed with patient? Yes  SHORT TERM GOALS: Target date: 09/23/2022  Pt will be independent with original HEP.  Baseline:  Goal status: IN PROGRESS  2.  Pt will verbalize a decrease in pain by 25% to increase her QOL and increase her involvement in her daily activities.  Baseline: NPS 7/10  Goal status: IN PROGRESS  3.  Pt will demonstrate a decrease in pain while performing UE MMT to improve her tolerance to strength training and increase her daily function Baseline: unable to test due to pain  Goal status: IN PROGRESS  4.  Pt will demonstrate an increase of 10 in bilateral shoulder flexion and abduction to increase her participation in yard and house work.  Baseline: see ROM chart  Goal status: IN PROGRESS  LONG TERM GOALS: Target date: 10/14/2022  Pt will demonstrate independence with ongoing HEP for self-management at home.  Baseline:  Goal status: IN PROGRESS  2.  Pt will report a score of </= 22.5 on the NDI to demonstrate an increase in function and decrease in pain Baseline: 30/50  Goal status: IN PROGRESS  3.  Pt will verbalize a decrease in pain of 50-75% to increase her QOL and increase her involvement in her community Baseline: NPS 7/10  Goal status: IN PROGRESS  4.  Patient to demonstrate improved upright posture with posterior shoulder girdle engaged to promote improved glenohumeral joint mobility.  Baseline:  Goal status: IN PROGRESS  5.  Pt will demonstrate a decrease in muscle tension  along her bilateral UT and periscapular muscles as evidenced by more relaxed posture in neck and upper shoulders. Baseline:  Goal status: IN PROGRESS  6.  Pt will be able to do her yard and house work with minimal to no pain and discomfort.  Baseline:  Goal status: IN PROGRESS  7.  Pt will increase her grossly cervical ROM in order to be able to look over her shoulder and surrounding areas while driving.  Baseline:  Goal status: IN PROGRESS   PLAN:  PT FREQUENCY: 2x/week  PT DURATION: 6 weeks  PLANNED INTERVENTIONS: Therapeutic exercises, Therapeutic activity, Neuromuscular re-education, Patient/Family education, Self Care, Joint mobilization, DME instructions, Dry Needling, Electrical stimulation, Spinal mobilization, Moist heat, Taping, Ultrasound, Manual therapy, and Re-evaluation  PLAN FOR NEXT SESSION: Try to start addressing STG pt permitting, try starting session with moist heat/e-stim to increase tolerance to  movement, on next visit with PT finish objective testing per pt pain tolerance.   Hayde Kilgour Romero-Perozo, Student-PT 09/19/2022, 4:21 PM

## 2022-09-23 ENCOUNTER — Ambulatory Visit: Payer: Medicaid Other | Admitting: Physical Therapy

## 2022-09-23 ENCOUNTER — Encounter: Payer: Self-pay | Admitting: Physical Therapy

## 2022-09-23 DIAGNOSIS — M6281 Muscle weakness (generalized): Secondary | ICD-10-CM

## 2022-09-23 DIAGNOSIS — R293 Abnormal posture: Secondary | ICD-10-CM

## 2022-09-23 DIAGNOSIS — M542 Cervicalgia: Secondary | ICD-10-CM

## 2022-09-23 DIAGNOSIS — M62838 Other muscle spasm: Secondary | ICD-10-CM

## 2022-09-23 NOTE — Therapy (Signed)
OUTPATIENT PHYSICAL THERAPY TREATMENT   Patient Name: Sylvia Martin MRN: [id number] DOB:Oct 01, 1965, 57 y.o., female Today's Date: 09/23/2022  END OF SESSION:  PT End of Session - 09/23/22 1535     Visit Number 5    Date for PT Re-Evaluation 10/14/22    Authorization Type [employer]    PT Start Time [id number]   Pt arrived late   PT Stop Time 1615    PT Time Calculation (min) 40 min    Activity Tolerance Patient limited by pain    Behavior During Therapy [facility] for tasks assessed/performed                Past Medical History:  Diagnosis Date   Depression    Fibromyalgia    Past Surgical History:  Procedure Laterality Date   ACDF C3-4     There are no problems to display for this patient.   PCP: Benito Mccreedy, MD  REFERRING PROVIDER: Trey Sailors, [place]  REFERRING DIAG: [phone] (ICD-10-CM) - Cervicalgia   THERAPY DIAG:  Cervicalgia  Other muscle spasm  Muscle weakness (generalized)  Abnormal posture  RATIONALE FOR EVALUATION AND TREATMENT: Rehabilitation  ONSET DATE: July 2023   NEXT MD VISIT: pt unsure    SUBJECTIVE:                                                                                                                                                                                                         SUBJECTIVE STATEMENT: Pt reports some mornings she has difficulty raising her arms due to muscle spasms in her arms. HEP is going "so-so".  PAIN:  Are you having pain? Yes: NPRS scale:  6/10 Pain location: bilat shoulder and neck  Pain description: achy   Aggravating factors: housework, yard work goes to 10/10 Relieving factors: rest & medication   PERTINENT HISTORY:  ACDF C4-5 with revision of C3-4 on 09/15/19; cervical fusion 2006;  Depression, Fibromyalgia, Neuropathy, DDD, HTN, Prediabetes   PRECAUTIONS: None  WEIGHT BEARING RESTRICTIONS: No  FALLS:  Has patient fallen in last 6 months? No  LIVING ENVIRONMENT: Lives with:  lives alone Lives in: House/apartment Stairs: No Has following equipment at home: None  OCCUPATION: On disability  PLOF: Independent and Leisure: house/yard work   PATIENT GOALS: be able to do house and yard work, muscle spasms and figure out why they are still there despite medication.    OBJECTIVE:   DIAGNOSTIC FINDINGS:  04/06/22 MRI Cervical Spine WO Contrast:  1. Although the study is mildly motion degraded, no acute findings or clear  explanation for the patient's symptoms is identified.  2. A right paracentral disc protrusion at C7-T1 and associated endplate degenerative changes have progressed from MRI of 2020. No cord deformity. Moderate right and mild left foraminal narrowing.  3. Solid interbody fusion post C3-5 fusion with mild residual chronic foraminal narrowing as detailed above.  4. Chronic foraminal narrowing at C5-6 and C6-7 due to spondylosis.   03/25/22 XR Spine Cervical:  1.  Postsurgical changes of C3-C5 cervical fusion with anterior plate and screws at [id number] and interbody spacers at C3-C4 and C4-C5. Hardware appears intact and similarly aligned. There is at least partial osseous fusion across the C3-C4 disc space. Osseous fusion across the C4-C5 disc space is less definitive. Recommend correlation with recent CT cervical spine.  2.  Evaluation for abnormal dynamic motion is limited due to poor flexion and extension views. If there is concern for pseudoarthrosis, repeat flexion and extension views should be considered.  3.  Multilevel degenerative disc and facet disease similar to prior, more completely demonstrated on recent CT. Degenerative disc disease appears most pronounced at C5-C6.   PATIENT SURVEYS:  NDI 30/50  COGNITION: Overall cognitive status: Within functional limits for tasks assessed  SENSATION: Tingling down both arms   POSTURE: rounded shoulders, forward head, and weight shift right   PALPATION: Tenderness along bilateral upper traps, infra  & supraspinatus, rhomboids, levator scapulae, biceps, triceps, forearms - very limited tolerance to palpation   CERVICAL ROM:   Active ROM AROM (deg) eval  Flexion 16 *  Extension 19*  Right lateral flexion 25 *  Left lateral flexion 27*  Right rotation Restricted and painful  Left rotation Restricted and painful    (Blank rows = not tested, *=pain )  UPPER EXTREMITY ROM:  Active ROM Right eval Left eval  Shoulder flexion Supine 160* Supine 160*  Shoulder extension WNL * WNL *  Shoulder abduction Supine 128* Supine 130*   Shoulder adduction    Shoulder extension    Shoulder internal rotation WNL* WNL*  Shoulder external rotation WNL* WNL*  Elbow flexion 55 40  Elbow extension -10 -10  Wrist flexion    Wrist extension    Wrist ulnar deviation    Wrist radial deviation    Wrist pronation    Wrist supination     (Blank rows = not tested, * = pain)  UPPER EXTREMITY MMT: defer due to pain intolerance   MMT Right eval Left eval  Shoulder flexion    Shoulder extension    Shoulder abduction    Shoulder adduction    Shoulder extension    Shoulder internal rotation    Shoulder external rotation    Middle trapezius    Lower trapezius    Elbow flexion    Elbow extension    Wrist flexion    Wrist extension    Wrist ulnar deviation    Wrist radial deviation    Wrist pronation    Wrist supination    Grip strength     (Blank rows = not tested)  JOINT MOBILITY:  Unable to test due to pain  CERVICAL SPECIAL TESTS:  Unable to test due to pain  FUNCTIONAL TESTS:  Unable to test due to pain   TODAY'S TREATMENT:  DATE:   09/23/22 MODALITIES:  TENS 7000 2nd Edition unit utilized t/o visit to promote improved tolerance for therapeutic exercises: SD1 modulation - rate '150Hz'$ , 130 pulse width, intensity to pt tolerance  THERAPEUTIC  EXERCISE: to improve flexibility, strength and mobility.  Verbal and tactile cues throughout for technique. UBE - L2.0 x 6 min (forwards x 5 min - attempted 1 min backward but reverted to forwards per pt preference d/t pain) Seated cervical retraction 10 x 5" - 2 finger guidance at chin Seated L/R UT stretch 3 x 10" Seated L/R LS stretch 3 x 10" Seated YTB row + scap retraction into pool noodle along spine in back of chair 10 x 3" Seated YTB B shoulder extension + scap retraction into pool noodle along spine in back of chair 10 x 3" Seated YTB B shoulder ER + scap retraction into pool noodle along spine in back of chair 10 x 3"  MANUAL THERAPY: To promote normalized muscle tension, improved flexibility, and reduced pain.  STM to B UT and LS - very limited tolerance Light pressure for manual TPR to B UT - cues for breathing and relaxation, but still limited tolerance KinesioTex (black) Ktape application to B UT/LS   09/19/22 THERAPEUTIC EXERCISE: to improve flexibility, strength and mobility.  Verbal and tactile cues throughout for technique. Moist heat + UBE L1 x5 min forward  Cervical isometrics:  - flexion x 5 3 sec hold  - sidebend x 5 3 sec hold  - rotation x 5 3 sec hold UT stretch 3x10" each side with moist heat  Levator stretch 3x10" each side with moist heat Seated shoulder rolls 2x10 Seated Scapular squeezes 2x10  Seated Chin Tucks x10  Shoulder Horizontal Abduction AROM w/ scapular retraction x10  Doorway pec stretch 3x10"  MODALITIES:  IFC to B neck and upper shoulders, 80-150 Hz, intensity to patient tolerance x 15 min  Moist heat to B neck and upper shoulders with estim    09/16/22 Therapeutic Exercise: to improve strength and mobility.  Demo, verbal and tactile cues throughout for technique.  UBE L1.0 x 4 min with moist heat Doorway pec stretch with moist heat 3x15" Standing scap retraction in doorway x 10  Standing chin tuck in doorway x 10 Cervical  isometrics:  -flexion x 5 3 sec hold  - sidebend x 5 3 sec hold  - rotation x 5 3 sec hold Seated thoracic ext x 10  Attempted IASTM with foam roll to cervical and thoracic PS muscles  Estim: IFC 80-150 Hz intensity to tolerance x 12 min with moist heat   PATIENT EDUCATION:  Education details: Ktape wearing and removal instructions  Person educated: Patient Education method: Theatre stage manager Education comprehension: verbalized understanding and needs further education  HOME EXERCISE PROGRAM: Access Code: [email or web address] URL: [email or web address] Date: 09/23/2022 Prepared by: Annie Paras  Exercises - Seated Upper Trapezius Stretch  - 1 x daily - 7 x weekly - 2 sets - 10 reps - 10 sec   hold - Seated Levator Scapulae Stretch  - 1 x daily - 7 x weekly - 2 sets - 10 reps - 10 second hold - Sternocleidomastoid Stretch  - 1 x daily - 7 x weekly - 3-5 sets - 10 sec hold - Seated Scapular Retraction  - 1 x daily - 7 x weekly - 2 sets - 10 reps - Standing Isometric Cervical Flexion with Manual Resistance  - 1 x daily - 7 x weekly - 2 sets - 10  reps - 5-10 sec hold - Standing Isometric Cervical Sidebending with Manual Resistance  - 1 x daily - 7 x weekly - 2 sets - 10 reps - 5-10 sec hold - Seated Isometric Cervical Rotation  - 1 x daily - 7 x weekly - 2 sets - 5 reps - 5-10 sec hold  Patient Education - Kinesiology tape   ASSESSMENT:  CLINICAL IMPRESSION:  Lyllah continues to report moderate to high levels of neck and upper shoulder pain with ongoing limitations in cervical and shoulder ROM as a result. She reports limited tolerance for her HEP due to pain but has noted positive response to estim for pain control at end of session in prior visits, therefore TENS unit utilized during therapeutic exercises today with pt reporting slightly better tolerance for exercises, but still very limited tolerance for manual STM or TPR despite severe increased muscle tension  persisting in neck and upper shoulder musculature. Session concluded with trial of Ktape application B UT and LS to promote facilitate decreased in muscle tension and pain reduction - will assess response next visit. Nishika will continue to benefit from skilled PT to address above deficits to improve mobility and activity tolerance with decreased pain interference, but if unable to find successful way of reducing pain and increasing activity/exercise tolerance, may need to consider encouraging pt to f/u with referring provider.  OBJECTIVE IMPAIRMENTS: decreased activity tolerance, decreased knowledge of condition, decreased knowledge of use of DME, decreased mobility, decreased ROM, decreased strength, increased fascial restrictions, impaired perceived functional ability, increased muscle spasms, impaired flexibility, impaired sensation, impaired tone, impaired UE functional use, improper body mechanics, postural dysfunction, and pain.   ACTIVITY LIMITATIONS: carrying, lifting, bending, sitting, sleeping, bathing, reach over head, and hygiene/grooming  PARTICIPATION LIMITATIONS: meal prep, cleaning, laundry, driving, community activity, and yard work  PERSONAL FACTORS: Past/current experiences, Time since onset of injury/illness/exacerbation, and 3+ comorbidities: ACDF C4-5 with revision of C3-4 on 09/15/19; cervical fusion 2006;  Depression, Fibromyalgia, Neuropathy, DDD, HTN, Prediabetes  are also affecting patient's functional outcome.   REHAB POTENTIAL: Good  CLINICAL DECISION MAKING: Evolving/moderate complexity  EVALUATION COMPLEXITY: Moderate   GOALS: Goals reviewed with patient? Yes  SHORT TERM GOALS: Target date: 09/23/2022  Pt will be independent with original HEP.  Baseline:  Goal status: IN PROGRESS  09/23/22 - pt reports limited tolerance for HEP  2.  Pt will verbalize a decrease in pain by 25% to increase her QOL and increase her involvement in her daily activities.  Baseline:  NPS 7/10  Goal status: IN PROGRESS  09/23/22 - pain currently 6/10 but up to 8/10 in recent visits  3.  Pt will demonstrate a decrease in pain while performing UE MMT to improve her tolerance to strength training and increase her daily function Baseline: unable to test due to pain  Goal status: IN PROGRESS  09/23/22 - pt remains unable to tolerate pressure on her neck/shoulders/arms or manual resistance to movement w/o increased pain  4.  Pt will demonstrate an increase of 10 in bilateral shoulder flexion and abduction to increase her participation in yard and house work.  Baseline: see ROM chart  Goal status: IN PROGRESS  LONG TERM GOALS: Target date: 10/14/2022  Pt will demonstrate independence with ongoing HEP for self-management at home.  Baseline:  Goal status: IN PROGRESS  2.  Pt will report a score of </= 22.5 on the NDI to demonstrate an increase in function and decrease in pain Baseline: 30/50  Goal status: IN PROGRESS  3.  Pt will verbalize a decrease in pain of 50-75% to increase her QOL and increase her involvement in her community Baseline: NPS 7/10  Goal status: IN PROGRESS  4.  Patient to demonstrate improved upright posture with posterior shoulder girdle engaged to promote improved glenohumeral joint mobility.  Baseline:  Goal status: IN PROGRESS  5.  Pt will demonstrate a decrease in muscle tension along her bilateral UT and periscapular muscles as evidenced by more relaxed posture in neck and upper shoulders. Baseline:  Goal status: IN PROGRESS  6.  Pt will be able to do her yard and house work with minimal to no pain and discomfort.  Baseline:  Goal status: IN PROGRESS  7.  Pt will increase her grossly cervical ROM in order to be able to look over her shoulder and surrounding areas while driving.  Baseline:  Goal status: IN PROGRESS   PLAN:  PT FREQUENCY: 2x/week  PT DURATION: 6 weeks  PLANNED INTERVENTIONS: Therapeutic exercises, Therapeutic activity,  Neuromuscular re-education, Patient/Family education, Self Care, Joint mobilization, DME instructions, Dry Needling, Electrical stimulation, Spinal mobilization, Moist heat, Taping, Ultrasound, Manual therapy, and Re-evaluation  PLAN FOR NEXT SESSION: Assess response to Ktape; assess STG #4 as well as reassess previously addressed STGs as able; try starting session with moist heat/e-stim to increase tolerance to movement/exercises   Percival Spanish, PT 09/23/2022, 4:46 PM

## 2022-09-25 ENCOUNTER — Ambulatory Visit: Payer: Medicaid Other

## 2022-10-01 ENCOUNTER — Ambulatory Visit: Payer: Medicaid Other | Attending: Physician Assistant

## 2022-10-01 DIAGNOSIS — M6281 Muscle weakness (generalized): Secondary | ICD-10-CM

## 2022-10-01 DIAGNOSIS — R293 Abnormal posture: Secondary | ICD-10-CM | POA: Diagnosis present

## 2022-10-01 DIAGNOSIS — M542 Cervicalgia: Secondary | ICD-10-CM | POA: Insufficient documentation

## 2022-10-01 DIAGNOSIS — M62838 Other muscle spasm: Secondary | ICD-10-CM | POA: Insufficient documentation

## 2022-10-01 NOTE — Therapy (Signed)
OUTPATIENT PHYSICAL THERAPY TREATMENT   Patient Name: Sylvia Martin MRN: [id number] DOB:1965/09/02, 57 y.o., female Today's Date: 10/01/2022  END OF SESSION:  PT End of Session - 10/01/22 1704     Visit Number 6    Date for PT Re-Evaluation 10/14/22    Authorization Type [employer]    PT Start Time 1618    PT Stop Time 1702    PT Time Calculation (min) 44 min    Activity Tolerance Patient limited by pain    Behavior During Therapy [facility] for tasks assessed/performed                 Past Medical History:  Diagnosis Date   Depression    Fibromyalgia    Past Surgical History:  Procedure Laterality Date   ACDF C3-4     There are no problems to display for this patient.   PCP: Benito Mccreedy, MD  REFERRING PROVIDER: Trey Sailors, [place]  REFERRING DIAG: [id number] (ICD-10-CM) - Cervicalgia   THERAPY DIAG:  Cervicalgia  Other muscle spasm  Muscle weakness (generalized)  Abnormal posture  RATIONALE FOR EVALUATION AND TREATMENT: Rehabilitation  ONSET DATE: July 2023   NEXT MD VISIT: pt unsure    SUBJECTIVE:                                                                                                                                                                                                         SUBJECTIVE STATEMENT: Pt reports feeling some relief from KT tape and TENS, the R side feels less stiff but the L side is stiff now.  PAIN:  Are you having pain? Yes: NPRS scale: 5/10 Pain location: bilat shoulder and neck  Pain description: achy   Aggravating factors: housework, yard work goes to 10/10 Relieving factors: rest & medication   PERTINENT HISTORY:  ACDF C4-5 with revision of C3-4 on 09/15/19; cervical fusion 2006;  Depression, Fibromyalgia, Neuropathy, DDD, HTN, Prediabetes   PRECAUTIONS: None  WEIGHT BEARING RESTRICTIONS: No  FALLS:  Has patient fallen in last 6 months? No  LIVING ENVIRONMENT: Lives with: lives alone Lives in:  House/apartment Stairs: No Has following equipment at home: None  OCCUPATION: On disability  PLOF: Independent and Leisure: house/yard work   PATIENT GOALS: be able to do house and yard work, muscle spasms and figure out why they are still there despite medication.    OBJECTIVE:   DIAGNOSTIC FINDINGS:  04/06/22 MRI Cervical Spine WO Contrast:  1. Although the study is mildly motion degraded, no acute findings or clear explanation for  the patient's symptoms is identified.  2. A right paracentral disc protrusion at C7-T1 and associated endplate degenerative changes have progressed from MRI of 2020. No cord deformity. Moderate right and mild left foraminal narrowing.  3. Solid interbody fusion post C3-5 fusion with mild residual chronic foraminal narrowing as detailed above.  4. Chronic foraminal narrowing at C5-6 and C6-7 due to spondylosis.   03/25/22 XR Spine Cervical:  1.  Postsurgical changes of C3-C5 cervical fusion with anterior plate and screws at [id number] and interbody spacers at C3-C4 and C4-C5. Hardware appears intact and similarly aligned. There is at least partial osseous fusion across the C3-C4 disc space. Osseous fusion across the C4-C5 disc space is less definitive. Recommend correlation with recent CT cervical spine.  2.  Evaluation for abnormal dynamic motion is limited due to poor flexion and extension views. If there is concern for pseudoarthrosis, repeat flexion and extension views should be considered.  3.  Multilevel degenerative disc and facet disease similar to prior, more completely demonstrated on recent CT. Degenerative disc disease appears most pronounced at C5-C6.   PATIENT SURVEYS:  NDI 30/50  COGNITION: Overall cognitive status: Within functional limits for tasks assessed  SENSATION: Tingling down both arms   POSTURE: rounded shoulders, forward head, and weight shift right   PALPATION: Tenderness along bilateral upper traps, infra & supraspinatus,  rhomboids, levator scapulae, biceps, triceps, forearms - very limited tolerance to palpation   CERVICAL ROM:   Active ROM AROM (deg) eval  Flexion 16 *  Extension 19*  Right lateral flexion 25 *  Left lateral flexion 27*  Right rotation Restricted and painful  Left rotation Restricted and painful    (Blank rows = not tested, *=pain )  UPPER EXTREMITY ROM:  Active ROM Right eval Left eval Right 10/01/22 Left 10/01/22  Shoulder flexion Supine 160* Supine 160* 104 112  Shoulder extension WNL * WNL *    Shoulder abduction Supine 128* Supine 130*  85 89  Shoulder adduction      Shoulder extension      Shoulder internal rotation WNL* WNL*    Shoulder external rotation WNL* WNL*    Elbow flexion 55 40    Elbow extension -10 -10    Wrist flexion      Wrist extension      Wrist ulnar deviation      Wrist radial deviation      Wrist pronation      Wrist supination       (Blank rows = not tested, * = pain)  UPPER EXTREMITY MMT: defer due to pain intolerance   MMT Right eval Left eval  Shoulder flexion    Shoulder extension    Shoulder abduction    Shoulder adduction    Shoulder extension    Shoulder internal rotation    Shoulder external rotation    Middle trapezius    Lower trapezius    Elbow flexion    Elbow extension    Wrist flexion    Wrist extension    Wrist ulnar deviation    Wrist radial deviation    Wrist pronation    Wrist supination    Grip strength     (Blank rows = not tested)  JOINT MOBILITY:  Unable to test due to pain  CERVICAL SPECIAL TESTS:  Unable to test due to pain  FUNCTIONAL TESTS:  Unable to test due to pain   TODAY'S TREATMENT:  DATE:  10/01/22 Therapeutic Exercise: to improve strength and mobility.  Demo, verbal and tactile cues throughout for technique.  Estim: IFC 80-150 Hz intensity to tolerance x 20  min - during exercise Seated chin tucks 10x5" with 2 finger  Seated anterior deltoid isometrics 10x5" bil Seated ER with scap retraction x 10; x 10 YTB Seated row with YTB x 10 Seated shoulder ext YTB x 10  Bil shoulder AROM assessed  Manual Therapy:  KinesioTex (black) Ktape application to B UT/LS  09/23/22 MODALITIES:  TENS 7000 2nd Edition unit utilized t/o visit to promote improved tolerance for therapeutic exercises: SD1 modulation - rate '150Hz'$ , 130 pulse width, intensity to pt tolerance  THERAPEUTIC EXERCISE: to improve flexibility, strength and mobility.  Verbal and tactile cues throughout for technique. UBE - L2.0 x 6 min (forwards x 5 min - attempted 1 min backward but reverted to forwards per pt preference d/t pain) Seated cervical retraction 10 x 5" - 2 finger guidance at chin Seated L/R UT stretch 3 x 10" Seated L/R LS stretch 3 x 10" Seated YTB row + scap retraction into pool noodle along spine in back of chair 10 x 3" Seated YTB B shoulder extension + scap retraction into pool noodle along spine in back of chair 10 x 3" Seated YTB B shoulder ER + scap retraction into pool noodle along spine in back of chair 10 x 3"  MANUAL THERAPY: To promote normalized muscle tension, improved flexibility, and reduced pain.  STM to B UT and LS - very limited tolerance Light pressure for manual TPR to B UT - cues for breathing and relaxation, but still limited tolerance KinesioTex (black) Ktape application to B UT/LS   09/19/22 THERAPEUTIC EXERCISE: to improve flexibility, strength and mobility.  Verbal and tactile cues throughout for technique. Moist heat + UBE L1 x5 min forward  Cervical isometrics:  - flexion x 5 3 sec hold  - sidebend x 5 3 sec hold  - rotation x 5 3 sec hold UT stretch 3x10" each side with moist heat  Levator stretch 3x10" each side with moist heat Seated shoulder rolls 2x10 Seated Scapular squeezes 2x10  Seated Chin Tucks x10  Shoulder Horizontal Abduction  AROM w/ scapular retraction x10  Doorway pec stretch 3x10"  MODALITIES:  IFC to B neck and upper shoulders, 80-150 Hz, intensity to patient tolerance x 15 min  Moist heat to B neck and upper shoulders with estim    09/16/22 Therapeutic Exercise: to improve strength and mobility.  Demo, verbal and tactile cues throughout for technique.  UBE L1.0 x 4 min with moist heat Doorway pec stretch with moist heat 3x15" Standing scap retraction in doorway x 10  Standing chin tuck in doorway x 10 Cervical isometrics:  -flexion x 5 3 sec hold  - sidebend x 5 3 sec hold  - rotation x 5 3 sec hold Seated thoracic ext x 10  Attempted IASTM with foam roll to cervical and thoracic PS muscles  Estim: IFC 80-150 Hz intensity to tolerance x 12 min with moist heat   PATIENT EDUCATION:  Education details: Ktape wearing and removal instructions  Person educated: Patient Education method: Theatre stage manager Education comprehension: verbalized understanding and needs further education  HOME EXERCISE PROGRAM: Access Code: [email or web address] URL: [email or web address] Date: 09/23/2022 Prepared by: Annie Paras  Exercises - Seated Upper Trapezius Stretch  - 1 x daily - 7 x weekly - 2 sets - 10 reps - 10 sec   hold -  Seated Levator Scapulae Stretch  - 1 x daily - 7 x weekly - 2 sets - 10 reps - 10 second hold - Sternocleidomastoid Stretch  - 1 x daily - 7 x weekly - 3-5 sets - 10 sec hold - Seated Scapular Retraction  - 1 x daily - 7 x weekly - 2 sets - 10 reps - Standing Isometric Cervical Flexion with Manual Resistance  - 1 x daily - 7 x weekly - 2 sets - 10 reps - 5-10 sec hold - Standing Isometric Cervical Sidebending with Manual Resistance  - 1 x daily - 7 x weekly - 2 sets - 10 reps - 5-10 sec hold - Seated Isometric Cervical Rotation  - 1 x daily - 7 x weekly - 2 sets - 5 reps - 5-10 sec hold  Patient Education - Kinesiology tape   ASSESSMENT:  CLINICAL IMPRESSION:  Pt did  report some relief from the KT tape and TENS from previous visit. Applied estim while doing exercises today, which improved her tolerance. She still completes exercises with slow and cautious movements d/t fear of pain. D/t benefit noted from KT tape  applied this post session to bil UT and LS for muscle relaxation. Overall her activity tolerance does seem to improve with estim applied and KT tape does seem to help with pain, she would continue to benefit from skilled therapy.  OBJECTIVE IMPAIRMENTS: decreased activity tolerance, decreased knowledge of condition, decreased knowledge of use of DME, decreased mobility, decreased ROM, decreased strength, increased fascial restrictions, impaired perceived functional ability, increased muscle spasms, impaired flexibility, impaired sensation, impaired tone, impaired UE functional use, improper body mechanics, postural dysfunction, and pain.   ACTIVITY LIMITATIONS: carrying, lifting, bending, sitting, sleeping, bathing, reach over head, and hygiene/grooming  PARTICIPATION LIMITATIONS: meal prep, cleaning, laundry, driving, community activity, and yard work  PERSONAL FACTORS: Past/current experiences, Time since onset of injury/illness/exacerbation, and 3+ comorbidities: ACDF C4-5 with revision of C3-4 on 09/15/19; cervical fusion 2006;  Depression, Fibromyalgia, Neuropathy, DDD, HTN, Prediabetes  are also affecting patient's functional outcome.   REHAB POTENTIAL: Good  CLINICAL DECISION MAKING: Evolving/moderate complexity  EVALUATION COMPLEXITY: Moderate   GOALS: Goals reviewed with patient? Yes  SHORT TERM GOALS: Target date: 09/23/2022  Pt will be independent with original HEP.  Baseline:  Goal status: IN PROGRESS  09/23/22 - pt reports limited tolerance for HEP  2.  Pt will verbalize a decrease in pain by 25% to increase her QOL and increase her involvement in her daily activities.  Baseline: NPS 7/10  Goal status: IN PROGRESS  09/23/22 - pain  currently 6/10 but up to 8/10 in recent visits  3.  Pt will demonstrate a decrease in pain while performing UE MMT to improve her tolerance to strength training and increase her daily function Baseline: unable to test due to pain  Goal status: IN PROGRESS  09/23/22 - pt remains unable to tolerate pressure on her neck/shoulders/arms or manual resistance to movement w/o increased pain  4.  Pt will demonstrate an increase of 10 in bilateral shoulder flexion and abduction to increase her participation in yard and house work.  Baseline: see ROM chart  Goal status: IN PROGRESS- need to assess in supine 10/01/22  LONG TERM GOALS: Target date: 10/14/2022  Pt will demonstrate independence with ongoing HEP for self-management at home.  Baseline:  Goal status: IN PROGRESS  2.  Pt will report a score of </= 22.5 on the NDI to demonstrate an increase in function and decrease  in pain Baseline: 30/50  Goal status: IN PROGRESS  3.  Pt will verbalize a decrease in pain of 50-75% to increase her QOL and increase her involvement in her community Baseline: NPS 7/10  Goal status: IN PROGRESS  4.  Patient to demonstrate improved upright posture with posterior shoulder girdle engaged to promote improved glenohumeral joint mobility.  Baseline:  Goal status: IN PROGRESS  5.  Pt will demonstrate a decrease in muscle tension along her bilateral UT and periscapular muscles as evidenced by more relaxed posture in neck and upper shoulders. Baseline:  Goal status: IN PROGRESS  6.  Pt will be able to do her yard and house work with minimal to no pain and discomfort.  Baseline:  Goal status: IN PROGRESS  7.  Pt will increase her grossly cervical ROM in order to be able to look over her shoulder and surrounding areas while driving.  Baseline:  Goal status: IN PROGRESS   PLAN:  PT FREQUENCY: 2x/week  PT DURATION: 6 weeks  PLANNED INTERVENTIONS: Therapeutic exercises, Therapeutic activity, Neuromuscular  re-education, Patient/Family education, Self Care, Joint mobilization, DME instructions, Dry Needling, Electrical stimulation, Spinal mobilization, Moist heat, Taping, Ultrasound, Manual therapy, and Re-evaluation  PLAN FOR NEXT SESSION: estim with exercises to improve tolerance; postural strength; KT tape to UT/LS   Artist Pais, PTA 10/01/2022, 5:04 PM

## 2022-10-10 ENCOUNTER — Ambulatory Visit: Payer: Medicaid Other

## 2022-10-14 ENCOUNTER — Ambulatory Visit: Payer: Medicaid Other | Admitting: Physical Therapy

## 2022-10-14 ENCOUNTER — Encounter: Payer: Self-pay | Admitting: Physical Therapy

## 2022-10-14 DIAGNOSIS — M6281 Muscle weakness (generalized): Secondary | ICD-10-CM

## 2022-10-14 DIAGNOSIS — M542 Cervicalgia: Secondary | ICD-10-CM | POA: Diagnosis not present

## 2022-10-14 DIAGNOSIS — M62838 Other muscle spasm: Secondary | ICD-10-CM

## 2022-10-14 DIAGNOSIS — R293 Abnormal posture: Secondary | ICD-10-CM

## 2022-10-14 NOTE — Therapy (Addendum)
OUTPATIENT PHYSICAL THERAPY TREATMENT / DISCHARGE SUMMARY  Progress Note  Reporting Period 09/02/2022 to 10/14/2022   See note below for Objective Data and Assessment of Progress/Goals.      Patient Name: Sylvia Martin MRN: [id number] DOB:[birth year], 57 y.o., female Today's Date: 10/14/2022  END OF SESSION:  PT End of Session - 10/14/22 1615     Visit Number 7    Date for PT Re-Evaluation 10/14/22    Authorization Type [employer]    PT Start Time 1615    PT Stop Time 1707    PT Time Calculation (min) 52 min    Activity Tolerance Patient limited by pain    Behavior During Therapy [facility] for tasks assessed/performed                 Past Medical History:  Diagnosis Date   Depression    Fibromyalgia    Past Surgical History:  Procedure Laterality Date   ACDF C3-4     There are no problems to display for this patient.   PCP: Jackie Plum, MD  REFERRING PROVIDER: Norm Salt, [place]  REFERRING DIAG: [phone] (ICD-10-CM) - Cervicalgia   THERAPY DIAG:  Cervicalgia  Other muscle spasm  Muscle weakness (generalized)  Abnormal posture  RATIONALE FOR EVALUATION AND TREATMENT: Rehabilitation  ONSET DATE: July 2023   NEXT MD VISIT: pt unsure    SUBJECTIVE:                                                                                                                                                                                                         SUBJECTIVE STATEMENT: Pt reports increased pain since the weather as turned cooler and the breeze picked up -making her neck stiffer and more difficult to turn.  PAIN:  Are you having pain? Yes: NPRS scale:  6/10 Pain location: bilat shoulder and neck  Pain description: achy   Aggravating factors: housework, yard work goes to 10/10 Relieving factors: rest & medication   PERTINENT HISTORY:  ACDF C4-5 with revision of C3-4 on 09/15/19; cervical fusion 2006;  Depression, Fibromyalgia, Neuropathy, DDD,  HTN, Prediabetes   PRECAUTIONS: None  WEIGHT BEARING RESTRICTIONS: No  FALLS:  Has patient fallen in last 6 months? No  LIVING ENVIRONMENT: Lives with: lives alone Lives in: House/apartment Stairs: No Has following equipment at home: None  OCCUPATION: On disability  PLOF: Independent and Leisure: house/yard work   PATIENT GOALS: be able to do house and yard work, muscle spasms and figure out why they are still there despite medication.  OBJECTIVE:   DIAGNOSTIC FINDINGS:  04/06/22 MRI Cervical Spine WO Contrast:  1. Although the study is mildly motion degraded, no acute findings or clear explanation for the patient's symptoms is identified.  2. A right paracentral disc protrusion at C7-T1 and associated endplate degenerative changes have progressed from MRI of 2020. No cord deformity. Moderate right and mild left foraminal narrowing.  3. Solid interbody fusion post C3-5 fusion with mild residual chronic foraminal narrowing as detailed above.  4. Chronic foraminal narrowing at C5-6 and C6-7 due to spondylosis.   03/25/22 XR Spine Cervical:  1.  Postsurgical changes of C3-C5 cervical fusion with anterior plate and screws at C4-C5 and interbody spacers at C3-C4 and C4-C5. Hardware appears intact and similarly aligned. There is at least partial osseous fusion across the C3-C4 disc space. Osseous fusion across the C4-C5 disc space is less definitive. Recommend correlation with recent CT cervical spine.  2.  Evaluation for abnormal dynamic motion is limited due to poor flexion and extension views. If there is concern for pseudoarthrosis, repeat flexion and extension views should be considered.  3.  Multilevel degenerative disc and facet disease similar to prior, more completely demonstrated on recent CT. Degenerative disc disease appears most pronounced at C5-C6.   PATIENT SURVEYS:  NDI 30/50  COGNITION: Overall cognitive status: Within functional limits for tasks  assessed  SENSATION: Tingling down both arms   POSTURE: rounded shoulders, forward head, and weight shift right   PALPATION: Tenderness along bilateral upper traps, infra & supraspinatus, rhomboids, levator scapulae, biceps, triceps, forearms - very limited tolerance to palpation   CERVICAL ROM:   Active ROM AROM (deg) eval AROM 10/14/22  Flexion 16 * 10 *  Extension 19* 20 *  Right lateral flexion 25 * 19 *  Left lateral flexion 27* 19 *  Right rotation Restricted and painful 25 *  Left rotation Restricted and painful  29 *   (Blank rows = not tested, *=pain )  UPPER EXTREMITY ROM:  Active ROM Right eval Left eval Right 10/01/22 Left 10/01/22 Right 10/14/22 Left 10/14/22  Shoulder flexion Supine 160* Supine 160* 104 112 82 * 116 *  Shoulder extension WNL * WNL *      Shoulder abduction Supine 128* Supine 130*  85 89 75 * 102 *  Shoulder adduction        Shoulder extension        Shoulder internal rotation WNL* WNL*      Shoulder external rotation WNL* WNL*      Elbow flexion 55 40      Elbow extension -10 -10      Wrist flexion        Wrist extension        Wrist ulnar deviation        Wrist radial deviation        Wrist pronation        Wrist supination         (Blank rows = not tested, * = pain)  UPPER EXTREMITY MMT: defer due to pain intolerance, unable to assess on 10/14/2022 due to pain  MMT Right eval Left eval Right 10/14/22 Left 10/14/22  Shoulder flexion      Shoulder extension      Shoulder abduction      Shoulder adduction      Shoulder extension      Shoulder internal rotation      Shoulder external rotation      Middle trapezius  Lower trapezius      Elbow flexion      Elbow extension      Wrist flexion      Wrist extension      Wrist ulnar deviation      Wrist radial deviation      Wrist pronation      Wrist supination      Grip strength       (Blank rows = not tested)  JOINT MOBILITY:  Unable to test due to pain  CERVICAL SPECIAL  TESTS:  Unable to test due to pain  FUNCTIONAL TESTS:  Unable to test due to pain   TODAY'S TREATMENT:                                                                                                                              DATE:   10/14/22 MODALITIES:  TENS 7000 2nd Edition unit utilized t/o visit to promote improved tolerance for therapeutic exercises: SD1 modulation - rate 150Hz , 130 pulse width, intensity to pt tolerance  THERAPEUTIC EXERCISE: to improve flexibility, strength and mobility.  Verbal and tactile cues throughout for technique. UBE - L2.0 x 6 min (forwards x 3 min - attempted 1 min backward but reverted to forwards per pt preference d/t pain) Cervical extension with pillowcase support x 10 Cervical extension SNAG with pillowcase x 10 Cervical rotation SNAG with pillowcase x 10  THERAPEUTIC ACTIVITIES: Cervical and UE ROM Unable to tolerate MMT resistance due to pain Goal assessment   10/01/22 Therapeutic Exercise: to improve strength and mobility.  Demo, verbal and tactile cues throughout for technique.  Estim: IFC 80-150 Hz intensity to tolerance x 20 min - during exercise Seated chin tucks 10x5" with 2 finger  Seated anterior deltoid isometrics 10x5" bil Seated ER with scap retraction x 10; x 10 YTB Seated row with YTB x 10 Seated shoulder ext YTB x 10  Bil shoulder AROM assessed  Manual Therapy:  KinesioTex (black) Ktape application to B UT/LS   09/23/22 MODALITIES:  TENS 7000 2nd Edition unit utilized t/o visit to promote improved tolerance for therapeutic exercises: SD1 modulation - rate 150Hz , 130 pulse width, intensity to pt tolerance  THERAPEUTIC EXERCISE: to improve flexibility, strength and mobility.  Verbal and tactile cues throughout for technique. UBE - L2.0 x 6 min (forwards x 5 min - attempted 1 min backward but reverted to forwards per pt preference d/t pain) Seated cervical retraction 10 x 5" - 2 finger guidance at chin Seated L/R UT  stretch 3 x 10" Seated L/R LS stretch 3 x 10" Seated YTB row + scap retraction into pool noodle along spine in back of chair 10 x 3" Seated YTB B shoulder extension + scap retraction into pool noodle along spine in back of chair 10 x 3" Seated YTB B shoulder ER + scap retraction into pool noodle along spine in back of chair 10 x 3"  MANUAL THERAPY: To promote normalized  muscle tension, improved flexibility, and reduced pain.  STM to B UT and LS - very limited tolerance Light pressure for manual TPR to B UT - cues for breathing and relaxation, but still limited tolerance KinesioTex (black) Ktape application to B UT/LS   PATIENT EDUCATION:  Education details: Ktape wearing and removal instructions  Person educated: Patient Education method: Chief Technology Officer Education comprehension: verbalized understanding and needs further education  HOME EXERCISE PROGRAM: Access Code: [email or web address] URL: [email or web address] Date: 10/14/2022 Prepared by: Glenetta Hew  Exercises - Seated Upper Trapezius Stretch  - 1 x daily - 7 x weekly - 2 sets - 10 reps - 10 sec   hold - Seated Levator Scapulae Stretch  - 1 x daily - 7 x weekly - 2 sets - 10 reps - 10 second hold - Sternocleidomastoid Stretch  - 1 x daily - 7 x weekly - 3-5 sets - 10 sec hold - Seated Scapular Retraction  - 1 x daily - 7 x weekly - 2 sets - 10 reps - Standing Isometric Cervical Flexion with Manual Resistance  - 1 x daily - 7 x weekly - 2 sets - 10 reps - 5-10 sec hold - Standing Isometric Cervical Sidebending with Manual Resistance  - 1 x daily - 7 x weekly - 2 sets - 10 reps - 5-10 sec hold - Seated Isometric Cervical Rotation  - 1 x daily - 7 x weekly - 2 sets - 5 reps - 5-10 sec hold - Cervical Extension AROM with Strap  - 1 x daily - 7 x weekly - 2 sets - 10 reps - 3 sec hold - Mid-Lower Cervical Extension SNAG with Strap  - 1 x daily - 7 x weekly - 2 sets - 10 reps - 3 sec hold - Seated Assisted Cervical  Rotation with Towel  - 1 x daily - 7 x weekly - 2 sets - 10 reps - 3 sec hold  Patient Education - Kinesiology tape - TENS Unit - TENS Therapy   ASSESSMENT:  CLINICAL IMPRESSION:  Tyshia continues to experience significant pain on a daily basis with intensity varying from day-to-day and subject to changes in the weather as well as other triggers.  She remains very sensitive to touch limiting ability for PT to perform palpation or manual therapy as well as MMT.  Her posture remains very guarded with limited cervical and B shoulder ROM due to pain.  She reports daily attempts to perform HEP but pain still limits performance.  She does note some relief from kinesiotaping as well as use of TENS unit, therefore information provided on home TENS unit options.  Given limited tolerance for physical therapy interventions and minimal to no progress noted towards goals, recommended she schedule appointment to follow-up with referring MD and placed her on hold for 30 days in the event that MD recommends continued PT.  OBJECTIVE IMPAIRMENTS: decreased activity tolerance, decreased knowledge of condition, decreased knowledge of use of DME, decreased mobility, decreased ROM, decreased strength, increased fascial restrictions, impaired perceived functional ability, increased muscle spasms, impaired flexibility, impaired sensation, impaired tone, impaired UE functional use, improper body mechanics, postural dysfunction, and pain.   ACTIVITY LIMITATIONS: carrying, lifting, bending, sitting, sleeping, bathing, reach over head, and hygiene/grooming  PARTICIPATION LIMITATIONS: meal prep, cleaning, laundry, driving, community activity, and yard work  PERSONAL FACTORS: Past/current experiences, Time since onset of injury/illness/exacerbation, and 3+ comorbidities: ACDF C4-5 with revision of C3-4 on 09/15/19; cervical fusion 2006;  Depression, Fibromyalgia, Neuropathy, DDD, HTN,  Prediabetes  are also affecting patient's  functional outcome.   REHAB POTENTIAL: Good  CLINICAL DECISION MAKING: Evolving/moderate complexity  EVALUATION COMPLEXITY: Moderate   GOALS: Goals reviewed with patient? Yes  SHORT TERM GOALS: Target date: 09/23/2022  Pt will be independent with original HEP.  Baseline:  Goal status: MET  10/14/22 - Pt reports good understanding of all exercise and tries to complete them daily but reports limited tolerance for HEP at times d/t pain  2.  Pt will verbalize a decrease in pain by 25% to increase her QOL and increase her involvement in her daily activities.  Baseline: NPS 7/10  Goal status: IN PROGRESS  10/14/22 - pain currently 6/10 but varies all the time per pt - unable to quantify change/improvement  3.  Pt will demonstrate a decrease in pain while performing UE MMT to improve her tolerance to strength training and increase her daily function Baseline: unable to test due to pain  Goal status: IN PROGRESS  10/14/22 - pt remains unable to tolerate pressure on her neck/shoulders/arms or manual resistance to movement w/o increased pain  4.  Pt will demonstrate an increase of 10 in bilateral shoulder flexion and abduction to increase her participation in yard and house work.  Baseline: see ROM chart  Goal status: PARTIALLY MET  10/14/22 - met for L shoulder  LONG TERM GOALS: Target date: 10/14/2022  Pt will demonstrate independence with ongoing HEP for self-management at home.  Baseline:  Goal status: PARTIALLY MET   10/14/22 - met for current HEP, but updated today  2.  Pt will report a score of </= 22.5 on the NDI to demonstrate an increase in function and decrease in pain Baseline: 30/50  Goal status: IN PROGRESS  10/14/22 - NT  3.  Pt will verbalize a decrease in pain of 50-75% to increase her QOL and increase her involvement in her community Baseline: NPS 7/10  Goal status: IN PROGRESS  10/14/22 - pain currently 6/10 but varies all the time per pt - unable to quantify  change/improvement  4.  Patient to demonstrate improved upright posture with posterior shoulder girdle engaged to promote improved glenohumeral joint mobility.  Baseline:  Goal status: IN PROGRESS  10/14/22 - pt continues to demonstrate increased guarding with elevation of B shoulders  5.  Pt will demonstrate a decrease in muscle tension along her bilateral UT and periscapular muscles as evidenced by more relaxed posture in neck and upper shoulders. Baseline:  Goal status: IN PROGRESS  10/14/22 - pt continues to demonstrate significant muscle guarding with poor tolerance to palpation or manual therapy  6.  Pt will be able to do her yard and house work with minimal to no pain and discomfort.  Baseline:  Goal status: IN PROGRESS  10/14/22 - pain still limits all activities  7.  Pt will increase her grossly cervical ROM in order to be able to look over her shoulder and surrounding areas while driving.  Baseline:  Goal status: IN PROGRESS  10/14/22 - pt continues to demonstrate significant restriction in cervical range of motion with no improvement and in some cases declining motion from eval   PLAN:  PT FREQUENCY: 2x/week  PT DURATION: 6 weeks  PLANNED INTERVENTIONS: Therapeutic exercises, Therapeutic activity, Neuromuscular re-education, Patient/Family education, Self Care, Joint mobilization, DME instructions, Dry Needling, Electrical stimulation, Spinal mobilization, Moist heat, Taping, Ultrasound, Manual therapy, and Re-evaluation  PLAN FOR NEXT SESSION: 30-day hold to allow patient to follow-up with referring MD due to limited  progress with physical therapy secondary to significant ongoing pain  Marry Guan, PT 10/14/2022, 6:20 PM   PHYSICAL THERAPY DISCHARGE SUMMARY  Visits from Start of Care: 7  Current functional level related to goals / functional outcomes: Refer to above clinical impression and goal assessment for status as of last visit on 10/14/2022. Patient was placed on  hold for 30 days due to limited progress and tolerance for PT in order to allow her to follow-up with MD. She has not returned to PT in >30 days, therefore will proceed with discharge from PT for this episode.     Remaining deficits: As above.  Patient demonstrating limited tolerance for therapy due to pain.   Education / Equipment: HEP, TENS  Patient agrees to discharge. Patient goals were partially met. Patient is being discharged due to lack of progress.  Marry Guan, PT 01/02/23, 10:55 AM  [facility] [address]  [address] [place], [place], [address] Phone: [phone]   Fax:  [phone]

## 2024-05-08 ENCOUNTER — Emergency Department (HOSPITAL_COMMUNITY)
Admission: EM | Admit: 2024-05-08 | Discharge: 2024-05-09 | Disposition: A | Attending: Emergency Medicine | Admitting: Emergency Medicine

## 2024-05-08 ENCOUNTER — Other Ambulatory Visit: Payer: Self-pay

## 2024-05-08 ENCOUNTER — Encounter (HOSPITAL_COMMUNITY): Payer: Self-pay | Admitting: Radiology

## 2024-05-08 DIAGNOSIS — R2231 Localized swelling, mass and lump, right upper limb: Secondary | ICD-10-CM | POA: Diagnosis not present

## 2024-05-08 DIAGNOSIS — Y92410 Unspecified street and highway as the place of occurrence of the external cause: Secondary | ICD-10-CM | POA: Insufficient documentation

## 2024-05-08 DIAGNOSIS — M542 Cervicalgia: Secondary | ICD-10-CM | POA: Insufficient documentation

## 2024-05-08 MED ORDER — OXYCODONE-ACETAMINOPHEN 5-325 MG PO TABS
1.0000 | ORAL_TABLET | ORAL | Status: DC | PRN
  Administered 2024-05-09: 1 via ORAL
  Filled 2024-05-08: qty 1

## 2024-05-08 NOTE — ED Triage Notes (Signed)
 Pt with swelling to the right hand.
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
## 2024-05-09 ENCOUNTER — Emergency Department (HOSPITAL_COMMUNITY)

## 2024-05-09 DIAGNOSIS — M542 Cervicalgia: Secondary | ICD-10-CM | POA: Diagnosis not present

## 2024-05-09 MED ORDER — NAPROXEN 375 MG PO TABS: 375.0000 mg | ORAL_TABLET | Freq: Two times a day (BID) | ORAL | 0 refills | Status: AC

## 2024-05-09 MED ORDER — CYCLOBENZAPRINE HCL 5 MG PO TABS: 5.0000 mg | ORAL_TABLET | Freq: Two times a day (BID) | ORAL | 0 refills | Status: AC | PRN
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
## 2024-05-09 NOTE — Discharge Instructions (Signed)
 As we discussed, your x-ray here is reassuring.  Please begin taking naproxen twice a day as needed for pain.  Take Flexeril twice a day as needed for pain.  Flexeril is a muscle relaxer.  Do not drive, operate machinery, mix with alcohol.  Please follow-up with your PCP.  Return to the ED with new symptoms.
